# Patient Record
Sex: Male | Born: 1985 | Marital: Married | Smoking: Never smoker
Health system: Northeastern US, Community
[De-identification: ages and names within clinical notes are randomized; demographics above are authoritative.]

---

## 2015-09-17 ENCOUNTER — Encounter (HOSPITAL_BASED_OUTPATIENT_CLINIC_OR_DEPARTMENT_OTHER): Payer: Self-pay | Admitting: Urology

## 2015-10-08 ENCOUNTER — Ambulatory Visit (HOSPITAL_BASED_OUTPATIENT_CLINIC_OR_DEPARTMENT_OTHER): Payer: Self-pay | Admitting: Urology

## 2015-10-13 ENCOUNTER — Ambulatory Visit (HOSPITAL_BASED_OUTPATIENT_CLINIC_OR_DEPARTMENT_OTHER): Payer: Self-pay | Admitting: Urology

## 2015-10-20 ENCOUNTER — Ambulatory Visit (HOSPITAL_BASED_OUTPATIENT_CLINIC_OR_DEPARTMENT_OTHER): Payer: Self-pay | Admitting: Urology

## 2015-12-08 ENCOUNTER — Encounter (HOSPITAL_BASED_OUTPATIENT_CLINIC_OR_DEPARTMENT_OTHER): Payer: Self-pay | Admitting: Urology

## 2015-12-08 ENCOUNTER — Ambulatory Visit (HOSPITAL_BASED_OUTPATIENT_CLINIC_OR_DEPARTMENT_OTHER): Payer: PRIVATE HEALTH INSURANCE | Admitting: Urology

## 2015-12-08 VITALS — BP 112/69 | HR 57 | Temp 97.9°F

## 2015-12-08 DIAGNOSIS — Z3009 Encounter for other general counseling and advice on contraception: Secondary | ICD-10-CM

## 2015-12-08 NOTE — Progress Notes (Signed)
This is a 30 year old gentleman who is seen in consult at the request of Elana Coltubea for consideration of Vasectomy.    Mr. Lucianne MussLima has 2 prior children (11, 2, 69mo).  He is currently sexually active with his wife, who has 3 prior children.  They are currently using condom as their primary form of preventing pregnancy, but are unhappy with this method and would like to consider a permanent solution.       With regards to urinary symptoms, Mr. Lucianne MussLima reports that he has no symptoms.      With regards to sexual function, Mr. Lucianne MussLima rates his ability to obtain erection as 10 out of 10.  He is content with his erectile function.    The patient takes no medications that affect hemostasis, such as aspirin, NSAIDS, plavix, heparin, or Coumadin.  He has no known personal or family history of a bleeding disorder.    REVIEW OF SYSTEMS:  Constitutional: Patient denies any unexpected weight change, chronic fatigue, fevers or chills.  Respiratory: Patient denies any shortness of breath, wheezing, or new unexplained cough.  Cardiovascular: Patient denies any chest pains, chest palpitations, or chest tightness.  GI: Patient denies abdominal pain and has a good appetite, no nausea/vomiting, and normal bowel function without hematochezia.  GU: As per HPI.  MS: Patient denies any flank pain, back pain, myalgias, or recent skeletal fractures.  Neurologic: Patient denies any new recent headaches or change in vision.  No unexplained dizziness, muscle weakness, or paresthesia.  Psychiatry: Patient denies any new confusion, depression or anxiety.  Heme: no adenopathy, no unexplained bruising or bleeding.  Skin: Patient denies and new rash, wounds, or other skin lesions.    PMH/PSH/Meds/ALL/SH/FH all reviewed and updated in Epic system    PHYSICAL EXAM:  Vital Signs: as recorded  General: Patient is well-developed, well-nourished, and in no apparent distress.   Head and Face: Face is symmetric, EOMI, PERRL.  No nystagmus  Neuro: Alert and  oriented to time, place, and person.  Affect is appropriate.  Neck: Without masses and trachea midline.  No thyroid enlargement.  Lungs: No respiratory distress, no intercostal retractions, no use of accessory muscles.  Abdomen: Soft, nontender, nondistended, with no organomegaly or palpable masses.  No unexplained abdominal scars and no hernia.  Back: No spinal or costovertebral tenderness to percussion.  Extemities: Lower extremities without edema and well perfused.  Lymphatic: No palpable cervical, supraclavicular, or inguinal lymphadenopathy.  Skin: No visible inguinal or corporeal dermatitis or mycosis.   Male Genitourinary:   Penis: and without lesions, fibrotic plaques, or tenderness.  Meatus without stricture, hypospadius, or epispadius.    Scrotum: No scrotal lesions.  Bilateral descended testes without masses or tenderness.  Bilateral vas deferens palpable.  Bilateral epididymii without mass, cyst, or tenderness.  No hydrocele, spermatocele, or varicocele.      ASSESSMENT AND PLAN:    This is a 30 year old year old gentleman who is considering vasectomy.    I spent time discussing with him that this is generally considered an irreversible procedure.  Should he decide to have a vasectomy reversal in the future, it is uncertain that such a procedure would be successful.  In addition, vasectomy reversals are generally paid for out-of-pocket and can range from $10,000-$30,000.  I also reviewed that often men will remarry or become involved with other people later in their life who are younger and desire children.  He understands this, but still desires to proceed with vasectomy.  I have explained that following the vasectomy, he should consider himself fertile for the following 90 days.  He and his partner should continue use of contraception during that time.  At the end of 90 days we will have him submit a specimen for semen analysis.  He should still consider himself fertile until he hears from me or my  office that there are NO sperm in the sample.    I have also discussed that despite our best efforts, there is still a small risk of recannulization with failure of the vasectomy (~1%).  Other risks include scrotal hematoma; and less commonly: infection, spermatocele, or chronic pain.    I have given him some more information and if he desires to proceed, we will schedule this for him in a timely fashion.  Our office will call him to schedule the appointment.  If he does not hear from us, he knows to call [phone].

## 2015-12-08 NOTE — Patient Instructions (Signed)
Vasectomy    A Vasectomy is a permanent form of contraception.  A Vasectomy does NOT produce immediate sterility, and you must use another form of birth control until we confirm absence of sperm 3-4 months AFTER the Vasectomy.     You will be called by our nursing and secretary staff within the next week to schedule your Vasectomy.  Many insurance companies ask that you wait one month after seeing your doctor to make sure you have no change in heart in deciding to have a vasectomy.   Most insurance companies will pay for a Vasectomy.  However, if you wish to have it reversed in the future, they generally do NOT cover that cost.  Cost to reverse vasectomy is $8,000.  Vasectomy reversals are not always successful.      If you do not hear from our office, please call [phone] to schedule.    Prior to Procedure  PREPARATION   Vasectomies are generally on Thursday afternoons at [facility].  Plan to take both Thursday and Friday off from work.  No heavy lifting or exercise for 1 week following procedure, so you may need to make arrangements with your workplace.     Stop taking any aspirin or ibuprofen containing substances (Excedrin, Advil, Motrin, Aleve, Naproxen) 7 days prior to your biopsy.  It is OK to take Tylenol / Acetaminophen if you have headache or pain.   Please purchase over-the-counter Acetaminophen (Tylenol) for pain and discomfort.   Purchase the 500 mg (Extra Strength Dose).  You will take these at the end of your procedure.   Remember to have a good breakfast and lunch on the day of your procedure.    Procedure   The procedure takes ~15 minutes.  You are awake for the procedure.   After numbing the skin, two small incisions are made on the sides of the scrotum.  The vas deferens are cut and the ends both cauterized and tied.   Risks: Risk of pregnancy following Vasectomy is 0.05% (1 in 2,000).  Bleeding, infection (1-2%), developing chronic  testicular pain in the future (1-2%).

## 2015-12-09 ENCOUNTER — Encounter (HOSPITAL_BASED_OUTPATIENT_CLINIC_OR_DEPARTMENT_OTHER): Payer: Self-pay

## 2016-01-14 ENCOUNTER — Encounter (HOSPITAL_BASED_OUTPATIENT_CLINIC_OR_DEPARTMENT_OTHER): Payer: Self-pay | Admitting: Urology

## 2016-01-14 ENCOUNTER — Ambulatory Visit (HOSPITAL_BASED_OUTPATIENT_CLINIC_OR_DEPARTMENT_OTHER): Payer: PRIVATE HEALTH INSURANCE | Admitting: Urology

## 2016-01-14 VITALS — BP 132/76 | HR 82

## 2016-01-14 DIAGNOSIS — Z9852 Vasectomy status: Secondary | ICD-10-CM | POA: Insufficient documentation

## 2016-01-14 DIAGNOSIS — Z302 Encounter for sterilization: Secondary | ICD-10-CM

## 2016-01-14 NOTE — Patient Instructions (Signed)
Vasectomy, Care After  Refer to this sheet in the next few weeks. These instructions provide you with information on caring for yourself after your procedure. Your health care provider may also give you more specific instructions. Your treatment has been planned according to current medical practices, but problems sometimes occur. Call your health care provider if you have any problems or questions after your procedure.  WHAT TO EXPECT AFTER THE PROCEDURE  After your procedure, it is typical to have the following:  · Slight swelling or redness or both at the surgical site.  · Mild pain or discomfort in the scrotum.  · Some oozing of blood from the cuts (incisions) made by the surgeon is normal during the first day or two after the procedure.  · Blood in the ejaculate is common and typically clears after a few days.  HOME CARE INSTRUCTIONS   · Only take over-the-counter or prescription medicines for pain, discomfort, or fever as directed by your health care provider.  · Avoid using nonsteroidal anti-inflammatory drugs (NSAIDs) because these can make bleeding worse.  · Apply ice to the injured area:  ¨ Put ice in a plastic bag.  ¨ Place a towel between your skin and the bag.  ¨ Leave the ice on for 20 minutes, 2-3 times a day.  · Avoid being active for the first 2 days after surgery.  · Wear a supporter while moving around for the first week after surgery. You may add some sterile fluffed bandages or a clean washcloth to the scrotal support if the scrotal support irritates your skin.  · Do not participate in sports or perform heavy physical labor for at least 2 weeks.  · You may have protected intercourse 7-10 days after your procedure. Remember, you are not sterile until follow-up specimens show no sperm in your ejaculate.  · Be sure to follow up with your surgeon as instructed to confirm sterility. It usually requires multiple ejaculations to clear the sperm located beyond the vasectomy site of blockage. You will  need at least two specimens showing an absence of sperm before you can resume unprotected intercourse.   SEEK MEDICAL CARE IF:   · You have redness, swelling, or increasing pain in the wounds or testicles (scrotum).  · You see pus coming from the wound.  · You have a fever.  · You notice a foul smell coming from the wound or dressing.  · You notice a breaking open of the stitches (suture) line or wound edges even after sutures have been removed.  · You have increased bleeding from the wounds.  SEEK IMMEDIATE MEDICAL CARE IF:   · You develop a rash.  · You have difficulty breathing.  · You have any reaction or side effects to medicines given.  MAKE SURE YOU:  · Understand these instructions.  · Will watch your condition.  · Will get help right away if you are not doing well or get worse.     This information is not intended to replace advice given to you by your health care provider. Make sure you discuss any questions you have with your health care provider.     Document Released: 06/03/2005 Document Revised: 11/19/2013 Document Reviewed: 06/03/2013  Elsevier Interactive Patient Education ©2016 [employer]   

## 2016-01-14 NOTE — Progress Notes (Signed)
29 year old male here for Vasectomy.    We had a long discussion on this issue at last visit.  Today, we reviewed that vasectomy should be considered permanent and that reversal in the future cannot be guaranteed and that the reversal procedure can be cost-prohibitive.  I have also discussed that despite our best efforts, there is still a small risk of recannulization with failure of the vasectomy (~1%).  Other risks include scrotal hematoma; and less commonly: infection, spermatocele, or chronic pain.      He understands all of the risks and benefits of this procedure and has decided that he would like to proceed with permanent sterilization.  I reviewed the surgical consent with him prior to his signing.  He was then brought to the procedure room.      PROCEDURE:  The patient was prepped and draped in supine position.  Procedural time-out was performed with consensus on patient and procedure by surgical and nursing staff.    We first infiltrated the scrotal skin overlying the palpated vas deferens on the Left with 1% Lidocaine.  A small puncture wound was made in the scrotal skin using a #15 blade and the ring forceps were used to grasp the vas deferens and bring it through the skin puncture incision.  We further infiltration the vas and the vas deferens sheath with the lidocaine prior to incising the vas deferens sheath with the #15 blade.  Subsequently, the vas deferen was isolated from the sheath with sharp hemostats and a second ringed forceps was used to elevate the vas out of the sheath.    A 1 cm segment of the vas was isolated with hemostats and then transected with the #15 blade.  This was sent to pathology.  The two vas ends were cauterized and using a 3-0 Prolene suture the ends were stick tied.  Finally, the vas sheath was oversewn over the cut, tied, and cauterized end.  We turned our attention to the Right side and repeated the procedure.   The cut specimen from the Right was also sent to  pathology.  Dermabond was placed over the two scrotal puncture wounds.  The patient tolerated the procedure well.      POST-PROCEDURE INSTRUCTIONS:  Patient was advised to take only Tylenol as needed for pain and to ice the area for the next 24 hours.  He also was advised not shower in 2 days and then not to bathe or immerse himself in water for at least one week.  He should also refrain from strenuous exercise and heavy lifting for at least 1-2 weeks.      I have explained that following the vasectomy, he should consider himself fertile for the following 90 days.  He and his partner should continue use of contraception during that time.  At the end of 90 days we will have him submit a specimen for semen analysis.  He should still consider himself fertile until he hears from me or my office that there are NO sperm in the sample.  He was given instructions and container for follow up after his post vasectomy semen analysis.

## 2016-01-14 NOTE — Progress Notes (Deleted)
Patient tolerated vasectomy procedure. VSS  Denies pain or dizziness. No evidence of bleeding. Site-care and follow-up instructions reviewed. Patient told to continue to practice birth control method of choice until achieves a negative semen analysis. First semen analysis in three months. Patient to make follow-up appt. Within two weeks to discuss analysis results. Patient verbalizes understanding.

## 2016-01-14 NOTE — Progress Notes (Signed)
Patient tolerated vasectomy procedure. VSS  Denies pain or dizziness. No evidence of bleeding. Site-care and follow-up instructions reviewed. Patient told to continue to practice birth control method of choice until achieves a negative semen analysis. First semen analysis in three months. Patient to make follow-up appt. Within two weeks to discuss analysis results. Patient verbalizes understanding.

## 2016-01-19 LAB — SURGICAL PATH SPECIMEN

## 2016-04-11 ENCOUNTER — Other Ambulatory Visit (HOSPITAL_BASED_OUTPATIENT_CLINIC_OR_DEPARTMENT_OTHER): Payer: Self-pay | Admitting: Registered Nurse

## 2016-04-11 ENCOUNTER — Ambulatory Visit: Payer: Self-pay | Admitting: Urology

## 2016-04-11 DIAGNOSIS — Z9852 Vasectomy status: Secondary | ICD-10-CM

## 2016-04-11 NOTE — Addendum Note (Signed)
Addended by: Jill PolingANG, LYNN on: 04/11/2016 02:16 PM     Modules accepted: Orders

## 2016-04-12 LAB — POST VAS SPERM ANALYSIS MANUAL
POST VAS SPERM CONCEN SPEC: ABSENT
POST VAS SPERM DIRECT WET MOUN: ABSENT

## 2016-04-18 ENCOUNTER — Encounter (HOSPITAL_BASED_OUTPATIENT_CLINIC_OR_DEPARTMENT_OTHER): Payer: Self-pay | Admitting: Urology

## 2016-04-18 ENCOUNTER — Ambulatory Visit (HOSPITAL_BASED_OUTPATIENT_CLINIC_OR_DEPARTMENT_OTHER): Payer: PRIVATE HEALTH INSURANCE | Admitting: Urology

## 2016-04-18 DIAGNOSIS — Z9852 Vasectomy status: Secondary | ICD-10-CM

## 2016-04-18 NOTE — Progress Notes (Signed)
CC: Vasectomy    HPI: Pt is a 30 y/o M s/p vasectomy with Dr. Melburn Hakeayala 12/2015 here for routine semen analysis. No issues since vasectomy - no bleeding, swelling or continued pain      SEMEN ANALYSIS: ABSENT      AP  Results of the semen analysis were given to the patient  He understands that this procedure was effective  I have told patient he may begin having sex without birth control  I also explained re-cannulization to the patient in case he has problems in the future  f/u prn

## 2016-05-05 NOTE — Progress Notes (Signed)
I have seen and examined the patient today.   I agree with the assessment and plan of Pamella Pertmanda Farrell, as delineated in her note from today.             ROS:  The patients Review of Systems was significant for None.  Genitourinary ROS is within the HPI.  All other ROS reviewed and negative for significant abnormality.    Current Medications, Allergies, PMH, PSH, and Social History were all reviewed and updated in EPIC.    PHYSICAL EXAM:  General: Patient is well-developed, well-nourished, and in no apparent distress.   Head and Face: Face is symmetric, EOMI, PERRL.  No nystagmus  Neuro: Alert and oriented to time, place, and person.  Affect is appropriate.  Neck: Without masses and trachea midline.  No thyroid enlargement.  Lungs: No respiratory distress, no intercostal retractions, no use of accessory muscles.  Extemities: Lower extremities without edema and well perfused.  Skin: No visible inguinal or corporeal dermatitis or mycosis.     We discussed that the pathology from the vasectomy shows complete transection of Vas Deferens on both sides.  We discussed that the semen analysis shows no sperm.   I have told patient he may stop using contraception.   Should his partner conceive a child, I have asked that he return to see me immediately.

## 2024-05-24 IMAGING — MR RM OMBRO DIREITO
4 of 6 series · 13 of 40 positions shown · non-contrast
Comparison: none

[Series 2: 3-plane loc direito · axial · 5.0mm · 1.02mm/px · z∈[+31,+171]mm · 3 of 15 slices shown]
[im 1/15]
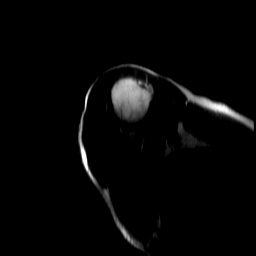
[im 10/15]
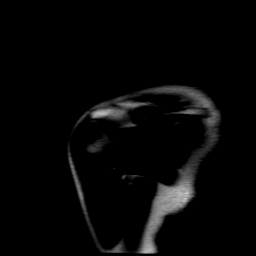
[im 15/15]
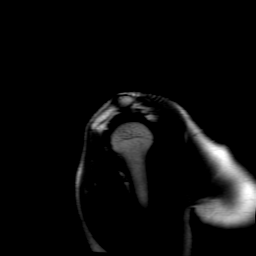

[Series 3: T2 fat-sat · axial · 3.5mm · 0.33mm/px · z∈[-86,+8]mm · 4 of 32 slices shown]
[im 1/32]
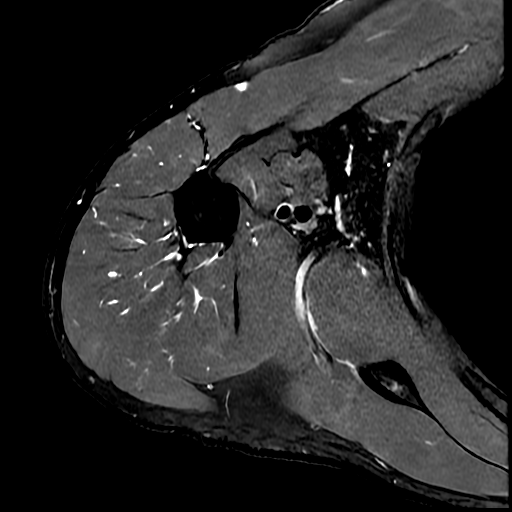
[im 5/32]
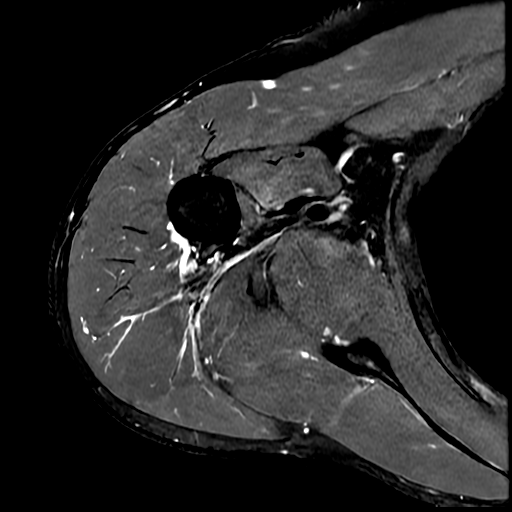
[im 18/32]
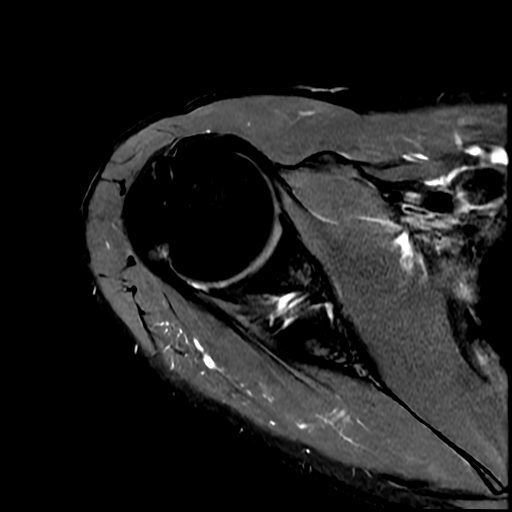
[im 27/32]
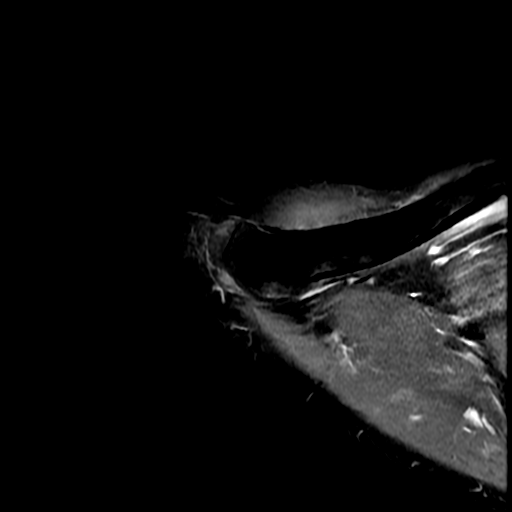

[Series 5: T1 · oblique · 4.0mm · 0.29mm/px · 3 of 26 slices shown (1 of 2)]
[im 6/26]
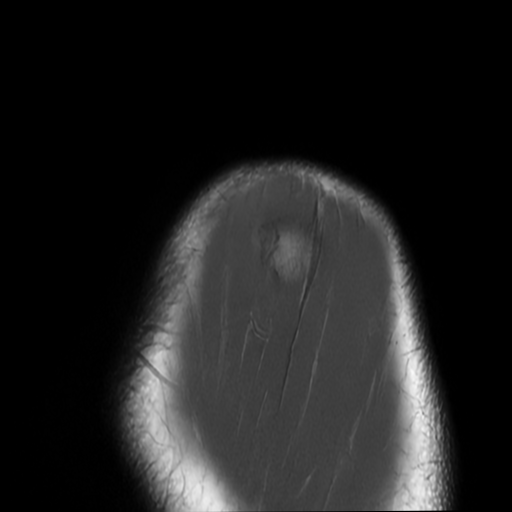
[im 16/26]
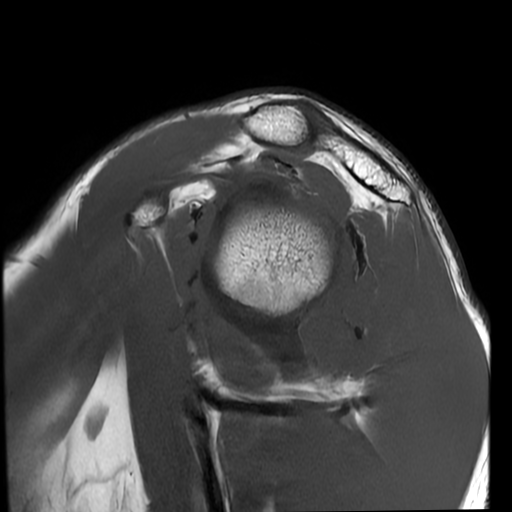
[im 26/26]
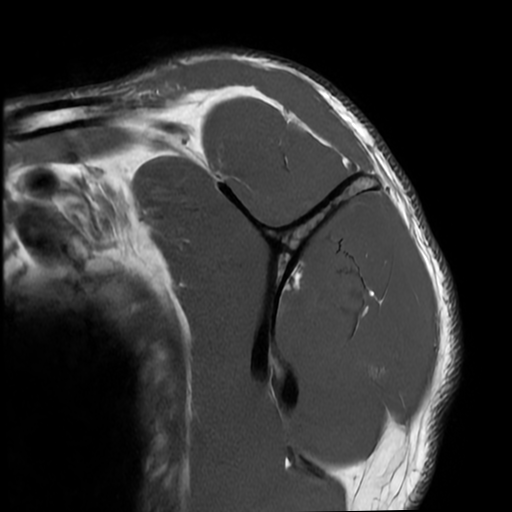

[Series 6: T1 · oblique · 4.0mm · 0.16mm/px · 3 of 32 slices shown (2 of 2)]
[im 5/32]
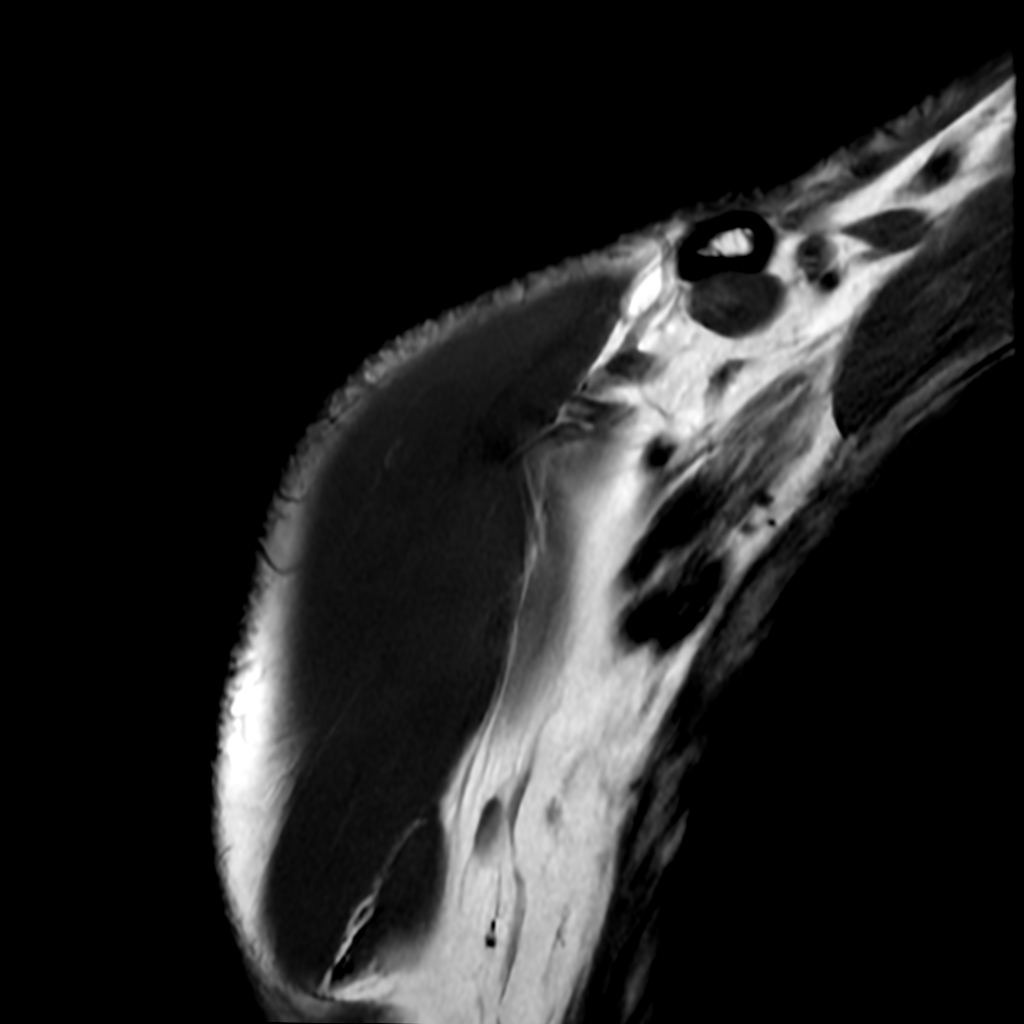
[im 18/32]
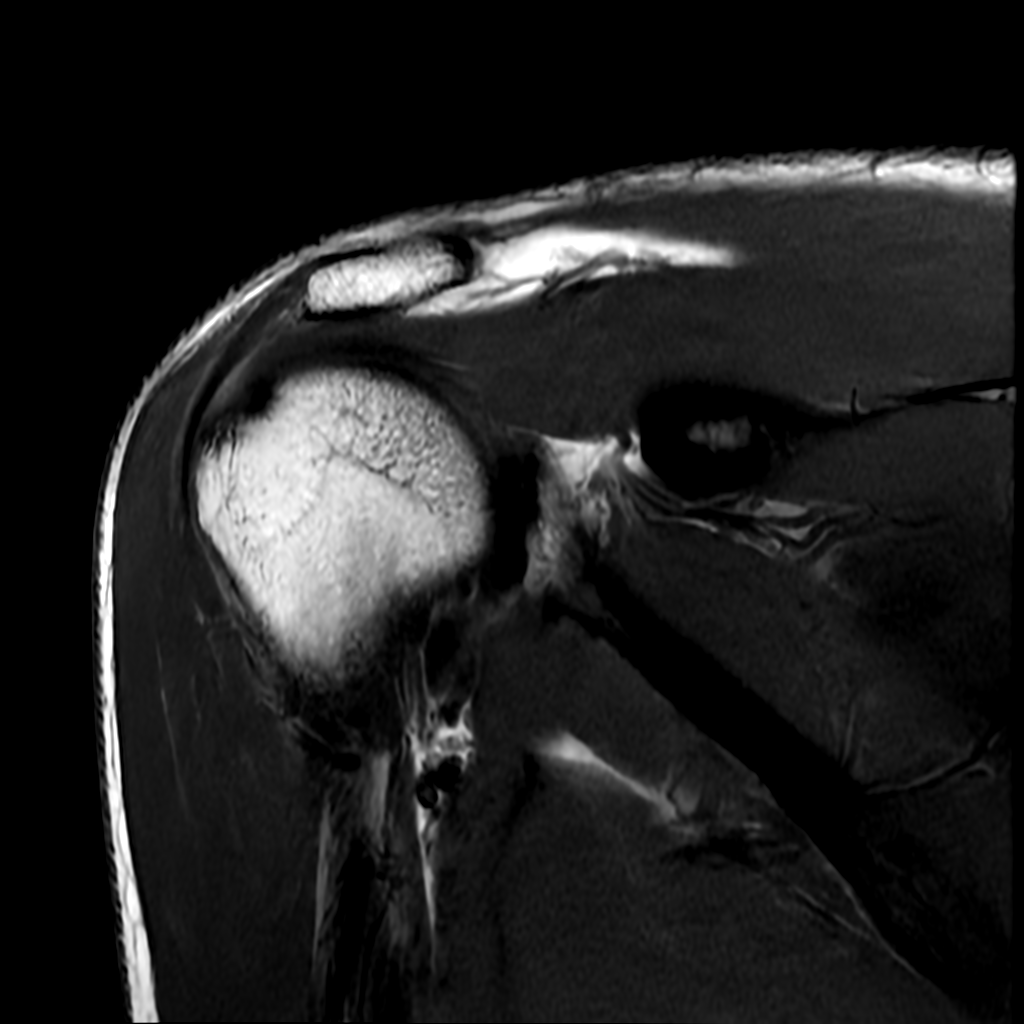
[im 27/32]
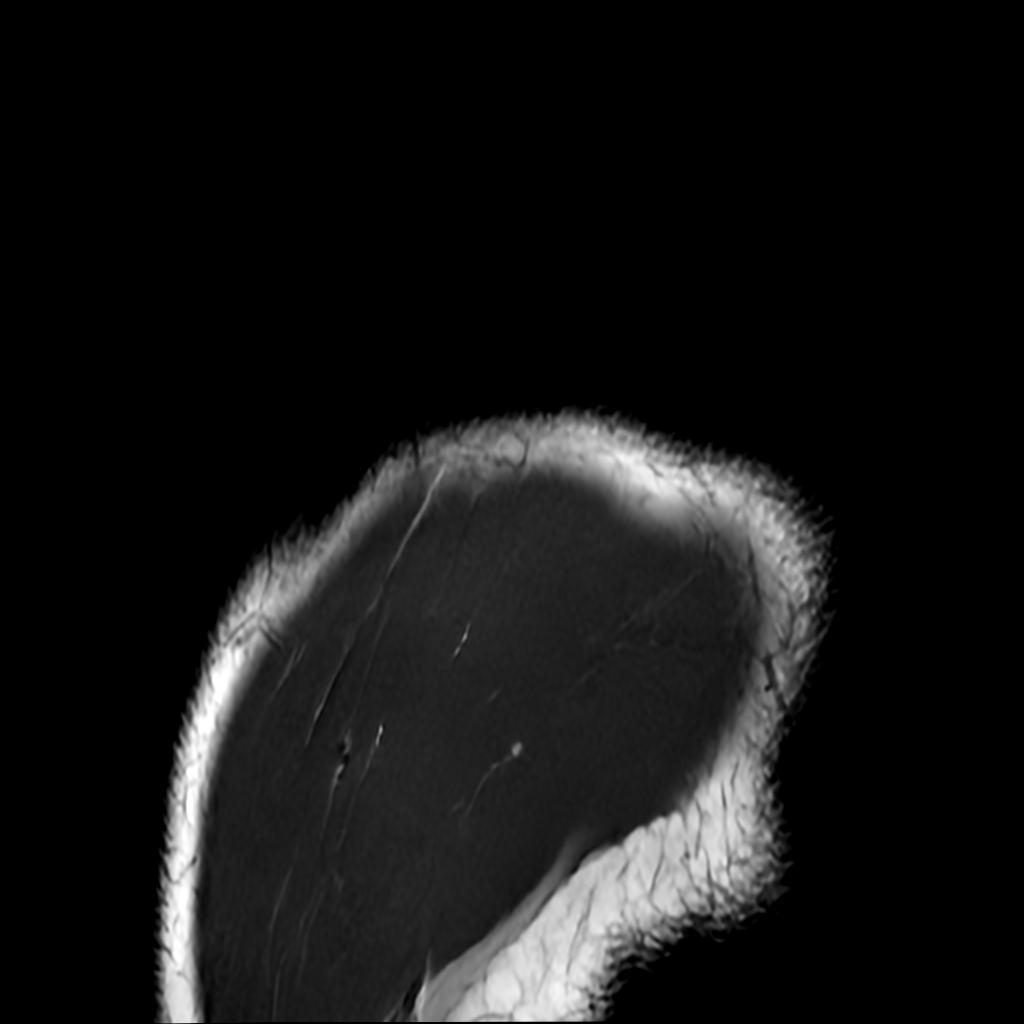

[13 of 40 positions shown; findings below may reference images not displayed]

Técnica: Exame  obtido  com  sequências  Spin  Echo  ponderadas  em  T1  e  T2,  sem
injeção do contraste paramagnético.

Relatório:
RESSONÂNCIA MAGNÉTICA DO OMBRO DIREITO
Tendões  do  supra-espinhal  e  infra-espinhal  com  alteração  de  sinal,  sugerindo
tendinose, sem evidências de rupturas transfixantes.
Tendões do subescapular e redondo menor de aspecto preservado.
Grupamentos musculares do manguito rotador com trofismo preservado.
Pequena efusão bursal subacromial-subdeltóide.
Tendão  do  cabo  longo  do  bíceps  em  situação  tópica  na  corredeira  bicipital,  com
espessura e sinal normais.
Leve alteração de sinal do labrum superior, sugerindo altera ção degenerativa.
Espaço  articular  glenumeral  preservado,  sem  evidências  de  derrame  articular
significativo.
Acrômio tipo I sem inclinação patológica.
Sinais  de leve  alteração  degenerativa acrômio-clavicular,  caracterizado  por  leve
irregularidade do contorno e leve hipertrofia osteocapsular.

Impressão Diagnóstica:
Tendinose no supra-espinhal e infra-espinhal, sem rupturas transfixantes.
Pequena efusão bursal subacromial-subdeltóide.
Sinais de alteração degenerativa do labrum superior.
Leve alteração degenerativa acrômio-clavicular.

## 2024-05-24 IMAGING — MR RM CERVICAL/DORSAL
4 of 11 series · 11 of 48 positions shown · non-contrast
Comparison: none

[Series 2: T2 · sagittal · 3.0mm · 0.39mm/px · 3 of 15 slices shown (1 of 4)]
[im 1/15]
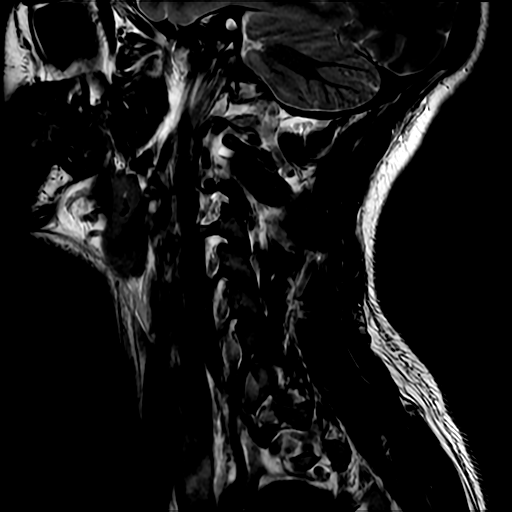
[im 8/15]
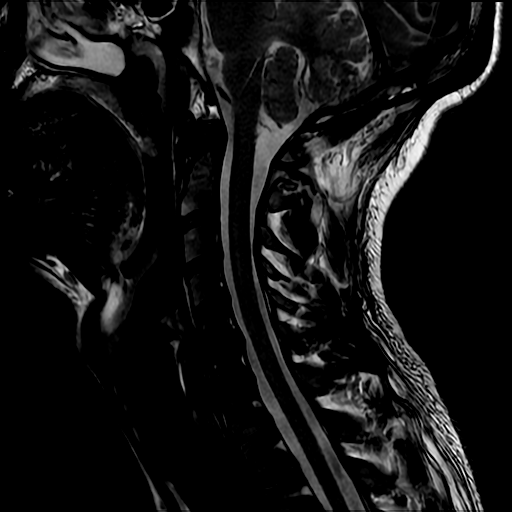
[im 15/15]
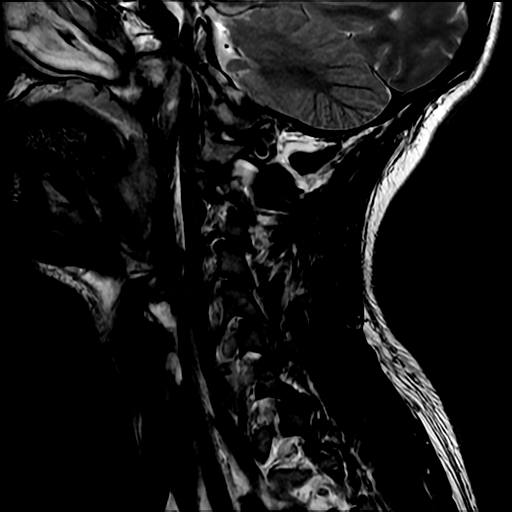

[Series 5: T2 · axial · 3.0mm · 0.29mm/px · z∈[-93,-17]mm · 3 of 36 slices shown (2 of 4)]
[im 6/36]
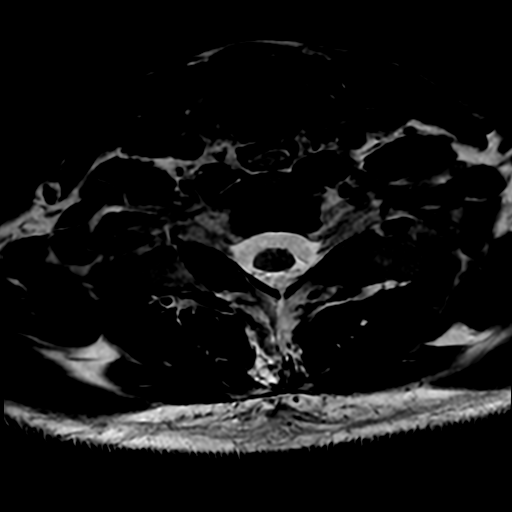
[im 18/36]
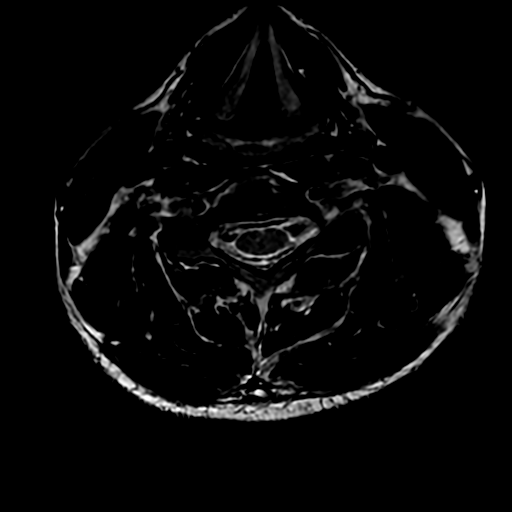
[im 30/36]
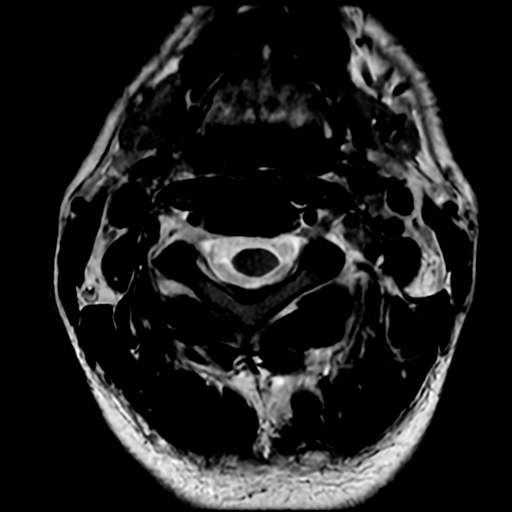

[Series 7: T2 · sagittal · 3.0mm · 0.36mm/px · 3 of 20 slices shown (3 of 4)]
[im 1/20]
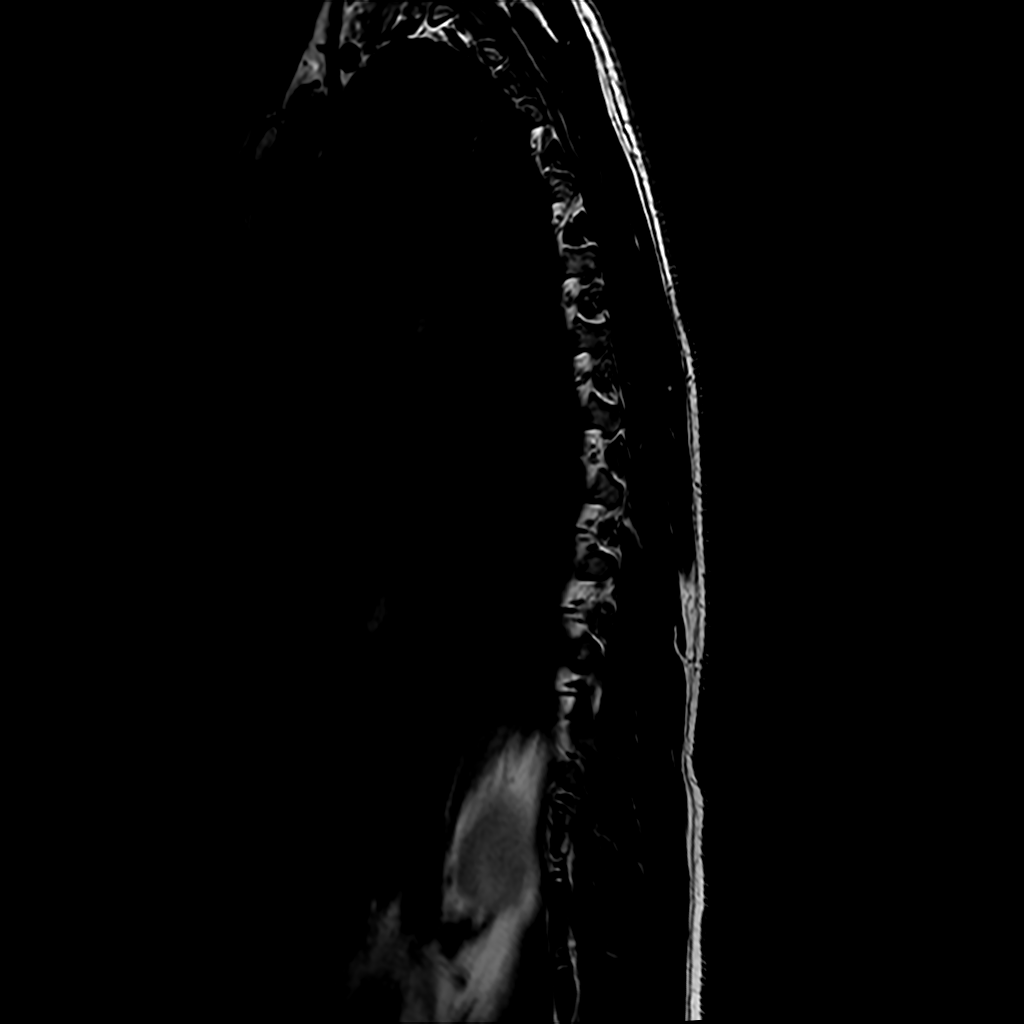
[im 13/20]
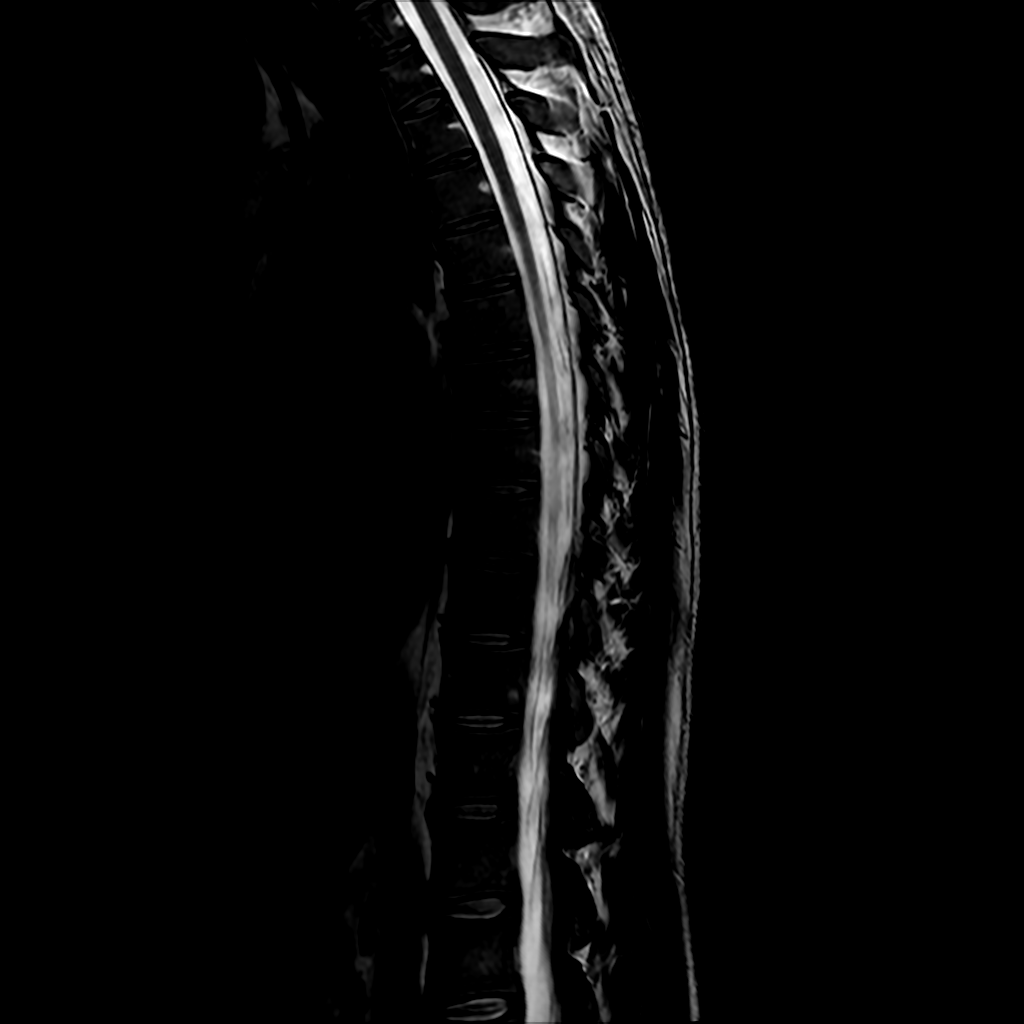
[im 20/20]
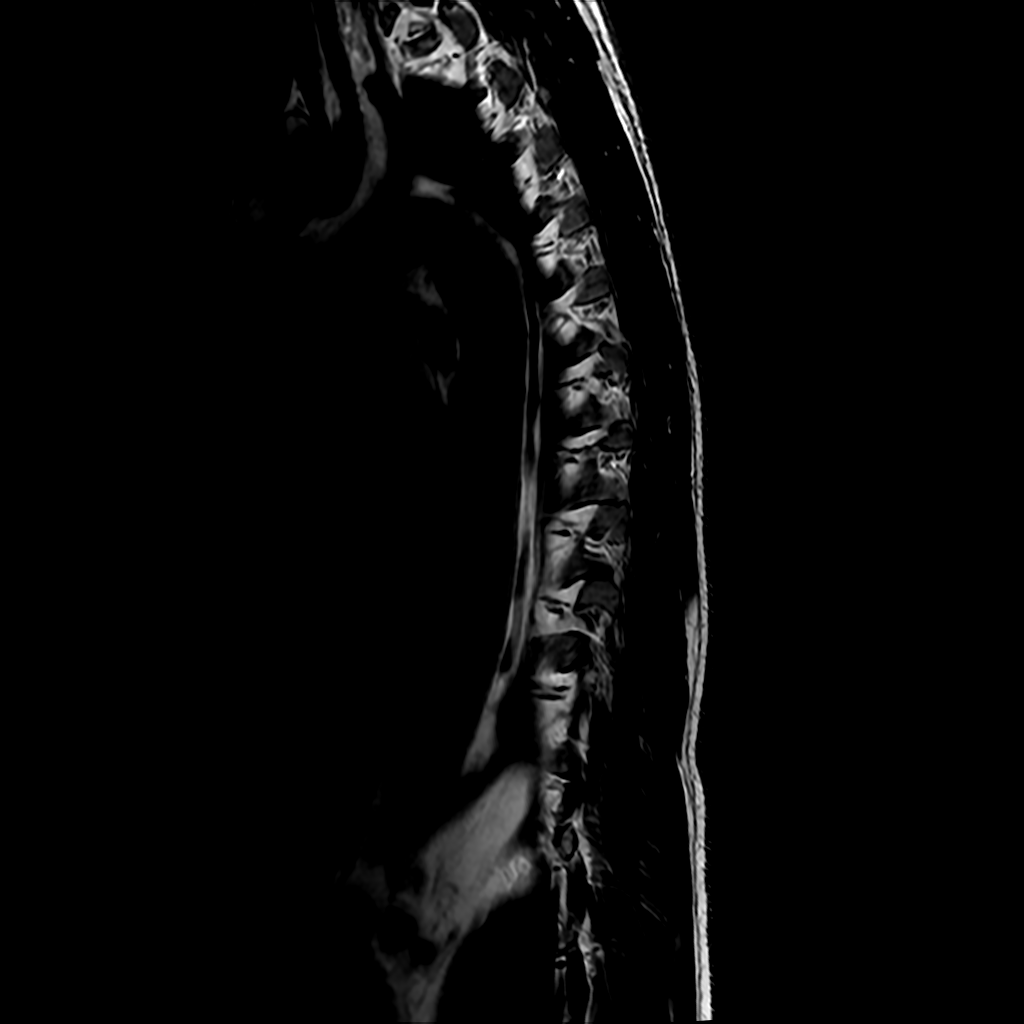

[Series 10: T2 · axial · 4.0mm · 0.37mm/px · z∈[-259,-201]mm · 2 of 38 slices shown (4 of 4)]
[im 7/38]
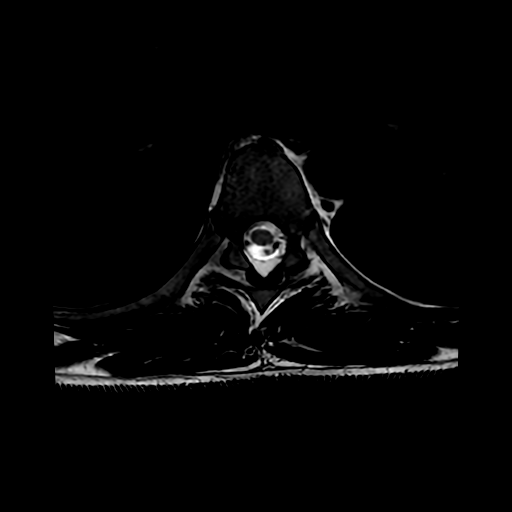
[im 19/38]
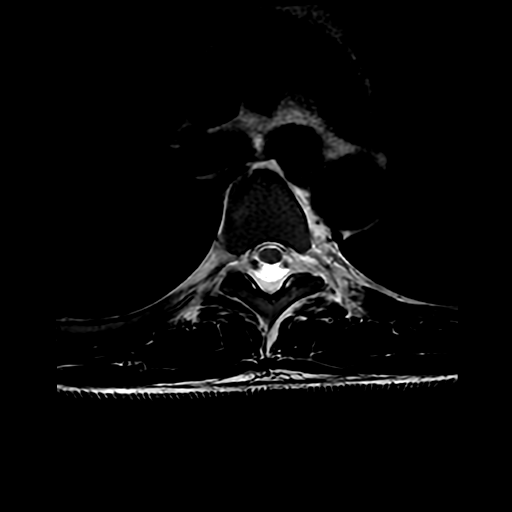

[11 of 48 positions shown; findings below may reference images not displayed]

Técnica: Realizamos sequências ponderadas em T1 e T2 nos planos sagital e axial.

Relatório:
Transição crânio-vertebral sem alterações.
RESSONÂNCIA MAGNÉTICA DA COLUNA CERVICAL
Corpos 
vertebrais 
com 
altura 
e 
alinhamento 
posterior 
preservados,
observando pequenos osteófitos marginais, notadamente em C5-C6.
Leve 
redução  de 
intensidade  de  sinal  em  T2  dos  discos 
intervertebrais
cervicais, notadamente em C5-C6, por processo degenerativo.
Em  C5-C6: pequena  protrusão  discal  posterior  mediana,  que  apaga  parcialmente  o
espaço subaracnóideo anterior, sem evidências compressivas sobre a medula espinhal.
Não há evidências de herniações discais nos demais níveis.
Pedículos e lâminas íntegros.
Canal vertebral e forames neurais com dimensões normais.
Medula com forma, calibre e características de sinal normais.

Impressão Diagnóstica:
Espondilose cervical com pequena protrusão discal posterior mediana em C5-C6.
